# Patient Record
Sex: Female | Born: 1955 | Race: White | Hispanic: No | Marital: Single | Smoking: Current every day smoker
Health system: Southern US, Community
[De-identification: ages and names within clinical notes are randomized; demographics above are authoritative.]

## PROBLEM LIST (undated history)

## (undated) DIAGNOSIS — N200 Calculus of kidney: Secondary | ICD-10-CM

## (undated) DIAGNOSIS — J45909 Unspecified asthma, uncomplicated: Secondary | ICD-10-CM

## (undated) DIAGNOSIS — R6889 Other general symptoms and signs: Secondary | ICD-10-CM

## (undated) DIAGNOSIS — R5383 Other fatigue: Secondary | ICD-10-CM

## (undated) DIAGNOSIS — I059 Rheumatic mitral valve disease, unspecified: Secondary | ICD-10-CM

## (undated) DIAGNOSIS — M419 Scoliosis, unspecified: Secondary | ICD-10-CM

## (undated) DIAGNOSIS — I639 Cerebral infarction, unspecified: Secondary | ICD-10-CM

## (undated) DIAGNOSIS — E785 Hyperlipidemia, unspecified: Secondary | ICD-10-CM

## (undated) DIAGNOSIS — F419 Anxiety disorder, unspecified: Secondary | ICD-10-CM

## (undated) DIAGNOSIS — Z720 Tobacco use: Secondary | ICD-10-CM

## (undated) DIAGNOSIS — I38 Endocarditis, valve unspecified: Secondary | ICD-10-CM

## (undated) DIAGNOSIS — I10 Essential (primary) hypertension: Secondary | ICD-10-CM

## (undated) DIAGNOSIS — Z8659 Personal history of other mental and behavioral disorders: Secondary | ICD-10-CM

## (undated) HISTORY — DX: Calculus of kidney: N20.0

## (undated) HISTORY — DX: Endocarditis, valve unspecified: I38

## (undated) HISTORY — DX: Hyperlipidemia, unspecified: E78.5

## (undated) HISTORY — DX: Scoliosis, unspecified: M41.9

## (undated) HISTORY — DX: Personal history of other mental and behavioral disorders: Z86.59

## (undated) HISTORY — DX: Cerebral infarction, unspecified: I63.9

## (undated) HISTORY — DX: Essential (primary) hypertension: I10

## (undated) HISTORY — DX: Other fatigue: R53.83

## (undated) HISTORY — DX: Unspecified asthma, uncomplicated: J45.909

## (undated) HISTORY — DX: Anxiety disorder, unspecified: F41.9

## (undated) HISTORY — DX: Rheumatic mitral valve disease, unspecified: I05.9

## (undated) HISTORY — PX: ABDOMINAL HYSTERECTOMY: SHX81

## (undated) HISTORY — DX: Tobacco use: Z72.0

## (undated) HISTORY — DX: Other general symptoms and signs: R68.89

---

## 2002-05-04 HISTORY — PX: CARDIAC SURGERY: SHX584

## 2004-05-04 HISTORY — PX: HERNIA REPAIR: SHX51

## 2008-05-04 HISTORY — PX: EYE SURGERY: SHX253

## 2011-05-06 DIAGNOSIS — I38 Endocarditis, valve unspecified: Secondary | ICD-10-CM | POA: Insufficient documentation

## 2011-05-06 DIAGNOSIS — R002 Palpitations: Secondary | ICD-10-CM | POA: Insufficient documentation

## 2011-05-06 DIAGNOSIS — I2510 Atherosclerotic heart disease of native coronary artery without angina pectoris: Secondary | ICD-10-CM | POA: Insufficient documentation

## 2011-05-06 DIAGNOSIS — I10 Essential (primary) hypertension: Secondary | ICD-10-CM | POA: Insufficient documentation

## 2011-06-14 DIAGNOSIS — N200 Calculus of kidney: Secondary | ICD-10-CM | POA: Insufficient documentation

## 2011-06-14 DIAGNOSIS — K929 Disease of digestive system, unspecified: Secondary | ICD-10-CM | POA: Insufficient documentation

## 2011-06-14 DIAGNOSIS — E785 Hyperlipidemia, unspecified: Secondary | ICD-10-CM | POA: Insufficient documentation

## 2011-06-14 DIAGNOSIS — Z8659 Personal history of other mental and behavioral disorders: Secondary | ICD-10-CM | POA: Insufficient documentation

## 2011-06-14 DIAGNOSIS — Z8742 Personal history of other diseases of the female genital tract: Secondary | ICD-10-CM | POA: Insufficient documentation

## 2011-06-14 DIAGNOSIS — M419 Scoliosis, unspecified: Secondary | ICD-10-CM | POA: Insufficient documentation

## 2011-06-14 DIAGNOSIS — Z720 Tobacco use: Secondary | ICD-10-CM | POA: Insufficient documentation

## 2011-06-14 DIAGNOSIS — J45909 Unspecified asthma, uncomplicated: Secondary | ICD-10-CM | POA: Insufficient documentation

## 2011-06-29 DIAGNOSIS — R6889 Other general symptoms and signs: Secondary | ICD-10-CM | POA: Insufficient documentation

## 2011-06-29 DIAGNOSIS — R5383 Other fatigue: Secondary | ICD-10-CM | POA: Insufficient documentation

## 2014-10-20 DIAGNOSIS — R569 Unspecified convulsions: Secondary | ICD-10-CM | POA: Insufficient documentation

## 2014-10-25 DIAGNOSIS — F419 Anxiety disorder, unspecified: Secondary | ICD-10-CM | POA: Insufficient documentation

## 2015-01-24 ENCOUNTER — Other Ambulatory Visit: Payer: Self-pay | Admitting: Family Medicine

## 2015-01-24 DIAGNOSIS — I82402 Acute embolism and thrombosis of unspecified deep veins of left lower extremity: Secondary | ICD-10-CM

## 2015-01-28 ENCOUNTER — Ambulatory Visit
Admission: RE | Admit: 2015-01-28 | Discharge: 2015-01-28 | Disposition: A | Discharge status: Home / Self Care | Payer: Medicare Other | Source: Ambulatory Visit | Attending: Family Medicine | Admitting: Family Medicine

## 2015-01-28 DIAGNOSIS — I82402 Acute embolism and thrombosis of unspecified deep veins of left lower extremity: Secondary | ICD-10-CM

## 2015-01-28 DIAGNOSIS — Z86718 Personal history of other venous thrombosis and embolism: Secondary | ICD-10-CM | POA: Insufficient documentation

## 2015-03-05 ENCOUNTER — Other Ambulatory Visit: Payer: Self-pay | Admitting: Ophthalmology

## 2015-03-05 DIAGNOSIS — H538 Other visual disturbances: Secondary | ICD-10-CM

## 2015-03-05 DIAGNOSIS — H5340 Unspecified visual field defects: Secondary | ICD-10-CM

## 2015-03-15 ENCOUNTER — Ambulatory Visit
Admission: RE | Admit: 2015-03-15 | Discharge: 2015-03-15 | Disposition: A | Discharge status: Home / Self Care | Payer: Medicare Other | Source: Ambulatory Visit | Attending: Ophthalmology | Admitting: Ophthalmology

## 2015-03-15 DIAGNOSIS — H5340 Unspecified visual field defects: Secondary | ICD-10-CM | POA: Insufficient documentation

## 2015-03-15 DIAGNOSIS — H538 Other visual disturbances: Secondary | ICD-10-CM | POA: Diagnosis present

## 2015-03-15 MED ORDER — GADOBENATE DIMEGLUMINE 529 MG/ML IV SOLN
10.0000 mL | Freq: Once | INTRAVENOUS | Status: AC | PRN
  Administered 2015-03-15: 10 mL via INTRAVENOUS

## 2015-05-22 ENCOUNTER — Ambulatory Visit: Payer: Medicare Other | Admitting: Pain Medicine

## 2015-08-28 ENCOUNTER — Other Ambulatory Visit: Payer: Self-pay

## 2015-08-28 ENCOUNTER — Ambulatory Visit: Payer: Self-pay | Admitting: Surgery

## 2015-09-09 ENCOUNTER — Other Ambulatory Visit: Payer: Self-pay

## 2015-09-09 ENCOUNTER — Encounter: Payer: Self-pay | Admitting: General Surgery

## 2015-09-09 ENCOUNTER — Ambulatory Visit (INDEPENDENT_AMBULATORY_CARE_PROVIDER_SITE_OTHER): Payer: Medicare Other | Admitting: General Surgery

## 2015-09-09 VITALS — BP 146/67 | HR 62 | Temp 97.9°F | Wt 134.0 lb

## 2015-09-09 DIAGNOSIS — K432 Incisional hernia without obstruction or gangrene: Secondary | ICD-10-CM | POA: Insufficient documentation

## 2015-09-09 NOTE — Progress Notes (Signed)
Patient ID: Brittany Sanders, female   DOB: [birth year], 60 y.o.   MRN: [id number]  CC: Hernia  HPI Brittany Sanders is a 60 y.o. female who presents to clinic today for evaluation of a ventral hernia. Patient has a complex history in the very poor historian. She tells a story of writing jet ski and feeling a sudden pop with bulge and pain. She now states every time she eats it bulges out "like the alien from that movie". She states she's had to decrease the amount she eats secondary to the holding and pain. She does have a history of chronic pain and is on chronic pain medication. Of note she has documentation from her surgeon at [facility] the diagnosis hernia in 2007. Patient denies any knowledge of this. She has chronic abdominal pain, chronic nausea, chronic constipation. She denies any fevers, chills, headache, shortness of breath. She does also have chronic chest pain. She has been seen for all these problems before at [facility] and was dissatisfied with her care there. She adamantly denies current tobacco use however has a strong odor of cigarette smoke.  HPI  Past Medical History  Diagnosis Date  . Anxiety   . Hyperactive airway disease   . Cold intolerance   . Stroke (cerebrum) (HCC)   . Dyslipidemia   . Fatigue   . H/O anorexia nervosa   . Hypertension   . Scoliosis   . Tobacco abuse   . Heart valve disease   . Calculus of kidney   . Mitral valve disorder     Past Surgical History  Procedure Laterality Date  . Abdominal hysterectomy    . Cardiac surgery  2004    stents were placed  . Hernia repair  2006    Done at [facility]  . Eye surgery Bilateral 2010    Family History  Problem Relation Age of Onset  . Heart disease Mother     triple heart bipass    Social History Social History  Substance Use Topics  . Smoking status: Current Every Day Smoker  . Smokeless tobacco: None  . Alcohol Use: No    Allergies  Allergen Reactions  . Azithromycin Anxiety    Other  Reaction: generic version  . Tape Rash and Shortness Of Breath    Uncoded Allergy. Allergen: generic Z-pak, Other Reaction: chest pain  . Clarithromycin   . Iodinated Diagnostic Agents   . Latex   . Lisinopril   . Metoprolol   . Penicillins   . Gabapentin Anxiety    Tightness in chest, high blood pressure  . Prednisone Rash    Other Reaction: Tachycardia  . Sulfa Antibiotics Rash    Other Reaction: blisters    Current Outpatient Prescriptions  Medication Sig Dispense Refill  . albuterol (VENTOLIN HFA) 108 (90 Base) MCG/ACT inhaler Inhale 1 puff into the lungs every morning.    Marland Kitchen ALPRAZolam (XANAX) 1 MG tablet Take 1 tablet by mouth.    Marland Kitchen aspirin EC 81 MG tablet Take 1 tablet by mouth.    Marland Kitchen atorvastatin (LIPITOR) 10 MG tablet Take 1 tablet by mouth.    . clonazePAM (KLONOPIN) 1 MG tablet Take 1 mg by mouth at bedtime.  0  . cycloSPORINE (RESTASIS) 0.05 % ophthalmic emulsion Apply 2 drops to eye 1 day or 1 dose.    . diazepam (VALIUM) 10 MG tablet Take 10 mg by mouth 3 (three) times daily.  0  . esomeprazole (NEXIUM) 40 MG capsule Take  1 tablet by mouth.    . metoprolol succinate (TOPROL XL) 25 MG 24 hr tablet Take 1 tablet by mouth.    . nicotine (RA NICOTINE) 21 mg/24hr patch Place 1 patch onto the skin.    Marland Kitchen. nitroGLYCERIN (NITROLINGUAL) 0.4 MG/SPRAY spray Place 1 spray under the tongue.    . nitroGLYCERIN (NITROSTAT) 0.4 MG SL tablet Take 1 tablet by mouth.    . ondansetron (ZOFRAN) 4 MG tablet Take 1 tablet by mouth as needed.    Marland Kitchen. PERCOCET 10-325 MG tablet Take 1 tablet by mouth.  0  . phenytoin (DILANTIN) 300 MG ER capsule Take 1 capsule by mouth.    . potassium chloride SA (K-DUR,KLOR-CON) 20 MEQ tablet Take 1 tablet by mouth 1 day or 1 dose.    . tiotropium (SPIRIVA HANDIHALER) 18 MCG inhalation capsule Place 2 puffs into inhaler and inhale.    Marland Kitchen. tiZANidine (ZANAFLEX) 4 MG tablet Take 1 tablet by mouth 1 day or 1 dose.    . triamterene-hydrochlorothiazide (DYAZIDE) 37.5-25  MG capsule Take 1 tablet by mouth.     No current facility-administered medications for this visit.     Review of Systems A Multi-point review of systems was asked and was negative except findings documented in the history of present illness  Physical Exam Blood pressure 146/67, pulse 62, temperature 97.9 F (36.6 C), temperature source Oral, weight 60.782 kg (134 lb). CONSTITUTIONAL: No acute distress. EYES: Pupils are equal, round, and reactive to light, Sclera are non-icteric. EARS, NOSE, MOUTH AND THROAT: The oropharynx is clear. The oral mucosa is pink and moist. Hearing is intact to voice. LYMPH NODES:  Lymph nodes in the neck are normal. RESPIRATORY:  Lungs are clear. There is normal respiratory effort, with equal breath sounds bilaterally, and without pathologic use of accessory muscles. CARDIOVASCULAR: Heart is regular but with an audible murmur. GI: The abdomen is soft, tender to palpation in all quadrants without obvious source, and nondistended. There is a visible and easily reducible midline incisional hernia at a prior 15 mm laparoscopic incision site. There is no hepatosplenomegaly. There are normal bowel sounds in all quadrants. GU: Rectal deferred.   MUSCULOSKELETAL: Normal muscle strength and tone. No cyanosis or edema.   SKIN: Turgor is good and there are no pathologic skin lesions or ulcers. NEUROLOGIC: Motor and sensation is grossly normal. Cranial nerves are grossly intact. PSYCH:  Oriented to person, place and time. Affect is normal.  Data Reviewed There are no images and labs reviewed for this visit I have personally reviewed the patient's imaging, laboratory findings and medical records.    Assessment    Incisional hernia    Plan    60 year old female with an incisional hernia from a laparoscopic trocar in the midline. Had long conversation with the patient about the surgery for repair. This includes the risks, benefits, alternatives of an open incisional  hernia repair. Also discussed at length the signs and symptoms of incarceration or strangulation and to seek immediate follow-up should they occur. Patient voiced understanding. Also had a long conversation patient given her multiple medical problems including cardiac and neurologic history that she would require cardiac and medical clearance prior to any surgery with [facility]. And although patient vehemently denied current smoking, she did have a strong odor of cigarettes and I discussed at length that if she is smoking that I would not do an elective hernia repair on her. Patient has a complex history and I did offer to send her  back to her original surgeon however she declined. She'll follow up after she completes medical and cardiac clearance.     Time spent with the patient was 60 minutes, with more than 50% of the time spent in face-to-face education, counseling and care coordination.     Brittany Frame, MD FACS General Surgeon 09/09/2015, 10:14 AM

## 2015-09-09 NOTE — Patient Instructions (Signed)
Can you please call your cardiologist-Dr. Okey Duprerawford and schedule an appointment to get cardiac clearance. We also need you to get medical clearance from Dr. Quillian QuinceBliss. Once you see them, please give us a call so we could schedule an appointment so we could schedule your surgery.  You are able to use an abdominal binder to help.

## 2015-09-10 ENCOUNTER — Telehealth: Payer: Self-pay

## 2015-09-10 NOTE — Telephone Encounter (Signed)
I requested Medical (Dr. Joen LauraLaura Bliss) and Cardiac [facility] Dr. Lovie CholLawrence Crawford) Clearance today. Awaiting for clearance in order for patient to follow up with our surgeon and then schedule her surgery.

## 2015-09-23 ENCOUNTER — Telehealth: Payer: Self-pay

## 2015-09-23 NOTE — Telephone Encounter (Signed)
Called patient to let her know that I had received cardiac clearance from Dr. Lovie CholLawrence Crawford. I told her that I was calling to schedule her follow up appointment to come and see Dr. Tonita CongWoodham to schedule surgery. However, patient did not want to come in until she saw Dr. Lovie CholLawrence Crawford. She was confused as how he had agreed for her to have surgery with us if she had not seen Dr. Okey Duprerawford since last year (03/2015). Patient asked me to help her schedule an appointment with Dr. Okey Duprerawford before she comes in to see Dr. Tonita CongWoodham. I told her that I would help her. After I hung up with the patient, I called Dr. Frutoso Chaserawford's office [phone] and scheduled an appointment on (12/16/2015 at 1:00 PM).  Then i called patient back to let her know when her appointment would be with dr. Okey Duprerawford. Patient was appreciative and stated that when she would see Dr. Okey Duprerawford that she was going to let him know that she needed a Ventral Hernia Repair but first needed to be seen by him so he could give the okay for surgery.  I told patient that once she saw Dr. Okey Duprerawford, to call us and let us know so we could schedule a follow up appointment to schedule a surgery date. Patient understood and had no further questions.  ===View-only below this line===  ----- Message -----    From: Brittany Sanders, CMA    Sent: 09/20/2015      To: Brittany PortsMaritza Kao Sanders, CMA Subject: Clearance                                      Cardiac and medical clearance were sent to her doctors. Check on status and once it has been obtained, schedule a follow up appointment for Ventral Hernia with Dr. Tonita CongWoodham.

## 2015-10-14 ENCOUNTER — Telehealth: Payer: Self-pay

## 2015-10-14 NOTE — Telephone Encounter (Signed)
Called patient to let her know that I had received her cardiac and medical clearance and I was calling her to schedule an appointment with Dr. Tonita CongWoodham to discuss surgery. Patient stated that she was recently diagnosed with Pneumonia and would like to wait until she felt better. I asked her if she would like to see another physician to schedule her surgery soon and she stated that she wants to see Dr. Tonita CongWoodham. Therefore, I told patient that I could schedule a follow up appointment until July. Patient was fine with that. Her appointment will be on 11/18/2015 at 2:00 PM with Dr. Tonita CongWoodham at our [facility]. Patient agreed.

## 2015-10-24 ENCOUNTER — Telehealth: Payer: Self-pay

## 2015-10-24 NOTE — Telephone Encounter (Signed)
Received Return to sender Appointment reminder. Please verify address.

## 2015-11-18 ENCOUNTER — Encounter: Payer: Self-pay | Admitting: General Surgery

## 2015-11-18 ENCOUNTER — Ambulatory Visit (INDEPENDENT_AMBULATORY_CARE_PROVIDER_SITE_OTHER): Payer: Medicare Other | Admitting: General Surgery

## 2015-11-18 ENCOUNTER — Telehealth: Payer: Self-pay

## 2015-11-18 VITALS — BP 142/74 | HR 63 | Temp 98.1°F | Ht 63.0 in | Wt 135.0 lb

## 2015-11-18 DIAGNOSIS — K432 Incisional hernia without obstruction or gangrene: Secondary | ICD-10-CM

## 2015-11-18 NOTE — Telephone Encounter (Signed)
[facility] for Incisional Hernia Repair. Patient has COPD with recent fever and productive cough. Has history of stroke after a prior surgery. Not a candidate for surgery at [facility].

## 2015-11-18 NOTE — Telephone Encounter (Signed)
I have [facility] a [facility] for Ventral hernia repair. All clinic documentation has been faxed to [phone]. Patient will be contact with an appointment from [facility] after clinic notes have been reviewed.

## 2015-11-18 NOTE — Telephone Encounter (Signed)
Called patient to let her know of appointment with PCP Dr.Laura Bliss on 11/20/15 2 10:30. Patient verbalized understanding.    Dr.Bliss- 11/20/15 @ 10:30.

## 2015-11-18 NOTE — Progress Notes (Signed)
Outpatient Surgical Follow Up  11/18/2015  Brittany Sanders is an 60 y.o. female.   Chief Complaint  Patient presents with  . Follow-up    Ventral Hernia    HPI: 60 year old female returns to clinic today for follow-up of her ventral, incisional hernia. Patient reports that the area continues to bother her and has been causing more pain recently. She states that the area has always been reducible since an initial event several months ago that required her to lay down for reduction. The pain is worse with activities or if she becomes constipated. Patient reports she constantly feels bloated and that this is not changed in the last 2 months. She has a chronic cough that has been productive and reports that she was febrile over the weekend with worsening of her cough. The cough has also made the symptomatic hernia even worse. Patient is very anxious and has distorted thoughts about her prior surgical experiences as well as her cardiac, pulmonary, neurologic history. She currently denies any chest pain, nausea, vomiting, diarrhea, constipation. She does state she had a fever over the weekend, she has a chronic cough, chronic shortness of breath, chronic anxiety.  Past Medical History  Diagnosis Date  . Anxiety   . Hyperactive airway disease   . Cold intolerance   . Stroke (cerebrum) (HCC)   . Dyslipidemia   . Fatigue   . H/O anorexia nervosa   . Hypertension   . Scoliosis   . Tobacco abuse   . Heart valve disease   . Calculus of kidney   . Mitral valve disorder     Past Surgical History  Procedure Laterality Date  . Abdominal hysterectomy    . Cardiac surgery  2004    stents were placed  . Hernia [facility]  2006    [facility]  . Eye surgery Bilateral 2010    Family History  Problem Relation Age of Onset  . Heart disease Mother     triple heart bipass    Social History:  reports that she has been smoking.  She does not have any smokeless tobacco history on file. She reports  that she does not drink alcohol or use illicit drugs.  Allergies:  Allergies  Allergen Reactions  . Azithromycin Anxiety    Other Reaction: generic version  . Tape Rash and Shortness Of Breath    Uncoded Allergy. Allergen: generic Z-pak, Other Reaction: chest pain  . Clarithromycin   . Iodinated Diagnostic Agents   . Latex   . Lisinopril   . Metoprolol   . Penicillins   . Gabapentin Anxiety    Tightness in chest, high blood pressure  . Prednisone Rash    Other Reaction: Tachycardia  . Sulfa Antibiotics Rash    Other Reaction: blisters    Medications reviewed.    ROS A multipoint review of systems was completed, all pertinent positives and negatives are documented within the history of present illness and remainder are negative.   BP 142/74 mmHg  Pulse 63  Temp(Src) 98.1 F (36.7 C) (Oral)  Ht 5\' 3"  (1.6 m)  Wt 61.236 kg (135 lb)  BMI 23.92 kg/m2  Physical Exam Gen.: No acute distress, resting comfortably in the exam room Chest: Coarse breath sounds on auscultation without use of accessory muscles equal wall motion bilaterally Heart: Regular rate and rhythm without obvious murmur or dysrhythmia Abdomen: Soft, nondistended, obvious incisional hernia at a prior midline trocar site. The hernia itself is easily reducible but tender to  palpation. Extremities: Moves all extremities well without evidence of edema to any extremity Neuro: Cranial nerves grossly intact and patient has normal gait and stability   No results found for this or any previous visit (from the past 48 hour(s)). No results found.  Assessment/Plan:  1. Incisional hernia, without obstruction or gangrene 60 year old female with an incisional hernia in her midline above her umbilicus. Discussed with her surgical options for hernia repair to include open versus laparoscopic repair. After a long conversation about her numerous comorbidities including cardiac dysfunction, chronic pulmonary disease, history  of a stroke in the postoperative state from her prior surgery, the patient decided that it would be in her best interest to have any further surgery at a larger institution. Discussed that it would be technically possible to have her surgery at [facility] however should she have any complication she would likely require an urgent transfer to a larger institution. Patient voiced understanding and desires for further follow-up at [facility]. We will facilitate this from our clinic on an outpatient status. Discussed that should she have a bulge become stuck at her hernia site that she is report to the nearest emergency room which is a [facility] facility. Since answered to the patient's satisfaction. A total of 25 minutes was spent with this patient with the majority (greater than 50%) of it being counseling and coordination of her further care at [facility].     Ricarda Frame, MD FACS General Surgeon  11/18/2015,11:23 AM

## 2015-11-18 NOTE — Patient Instructions (Signed)
We are referring [facility] to [facility] for your [facility]. We will call and arrange an appointment for you and will call you as soon as this appointment has been made.  You will need to see Dr. Quillian QuinceBliss regarding your Pneumonia. We will call you with this appointment information as well.

## 2017-05-03 IMAGING — MR MR HEAD WO/W CM
13 series · 46 of 48 positions shown · IV contrast (multihance)
Comparison: None.

CLINICAL DATA: Blurred vision/visual field defect.

EXAM:
MRI HEAD WITHOUT AND WITH CONTRAST
TECHNIQUE: Multiplanar, multiecho pulse sequences of the brain and surrounding
structures were obtained without and with intravenous contrast.
CONTRAST:  10mL MULTIHANCE GADOBENATE DIMEGLUMINE 529 MG/ML IV SOLN

[Series 2: T1 · sagittal · 5.0mm · 0.45mm/px · 3 of 22 slices shown (1 of 2)]
[im 1/22]
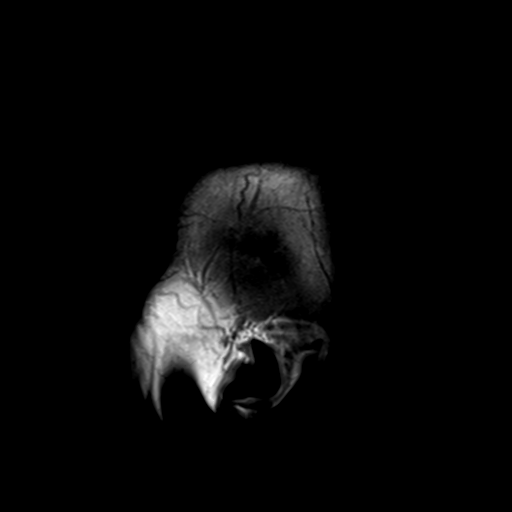
[im 11/22]
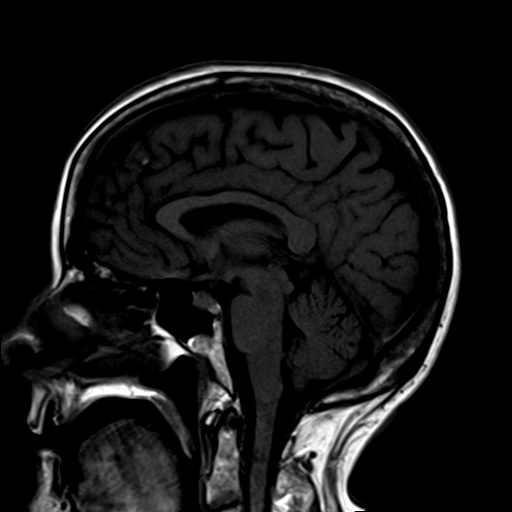
[im 22/22]
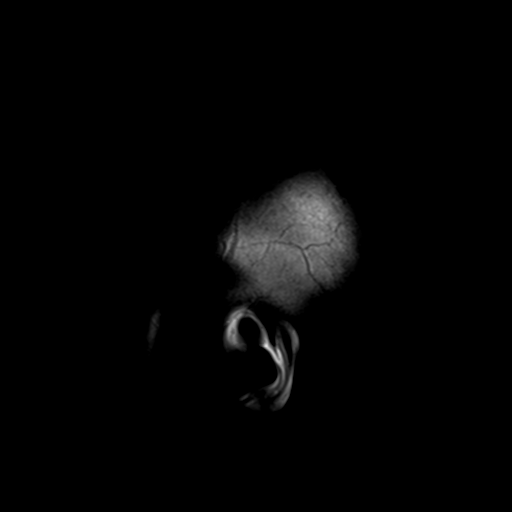

[Series 4: DWI · axial · 3.0mm · 1.20mm/px · z∈[-61,+102]mm · 5 of 56 slices shown (1 of 4)]
[im 1/56]
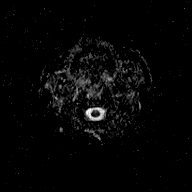
[im 14/56]
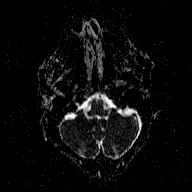
[im 28/56]
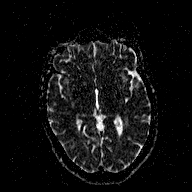
[im 42/56]
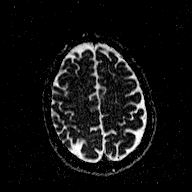
[im 56/56]
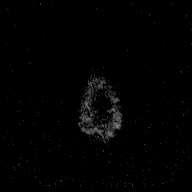

[Series 6: DWI · coronal · 3.0mm · 1.20mm/px · 4 of 46 slices shown (2 of 4)]
[im 1/46]
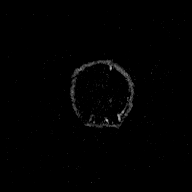
[im 16/46]
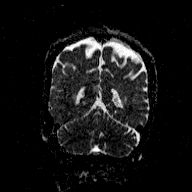
[im 31/46]
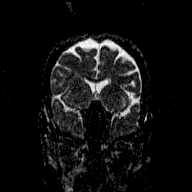
[im 46/46]
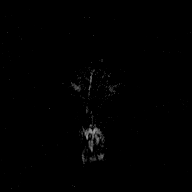

[Series 7: T2 · axial · 5.0mm · 0.72mm/px · z∈[-60,+101]mm · 2 of 26 slices shown (1 of 2)]
[im 1/26]
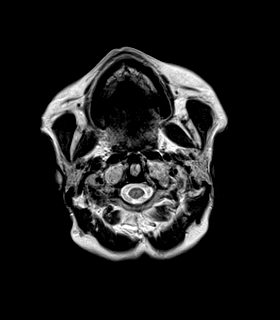
[im 26/26]
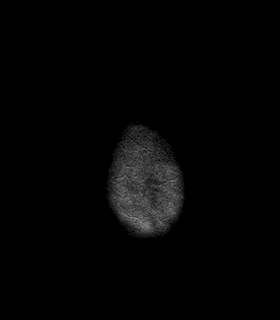

[Series 8: FLAIR · axial · 5.0mm · 0.45mm/px · z∈[-60,+101]mm · 2 of 26 slices shown (1 of 2)]
[im 1/26]
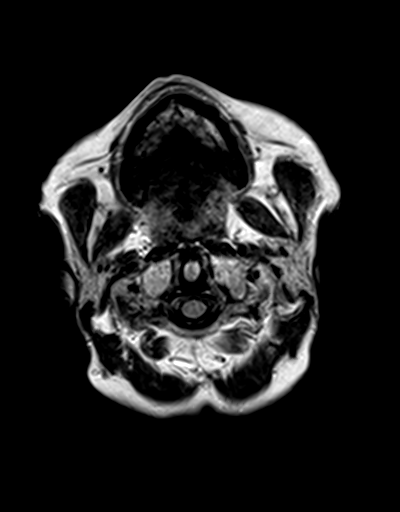
[im 26/26]
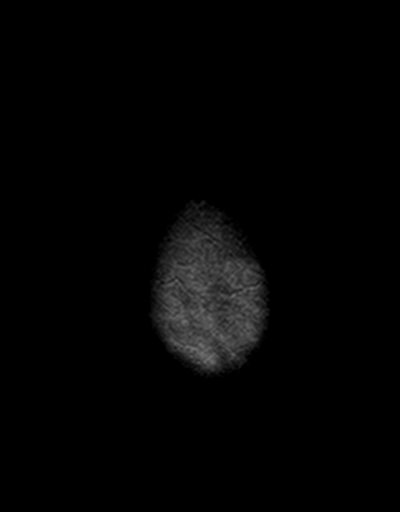

[Series 9: T2 · axial · 5.0mm · 0.72mm/px · z∈[-60,+101]mm · 2 of 26 slices shown (2 of 2)]
[im 1/26]
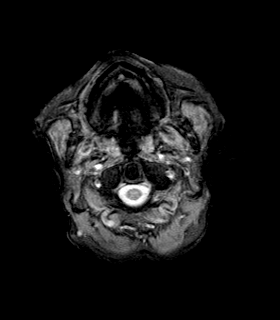
[im 26/26]
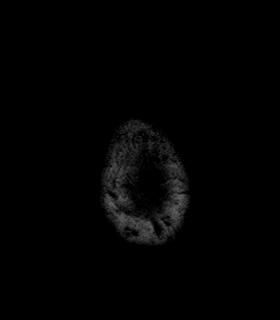

[Series 10: T1 · axial · 3.0mm · 1.00mm/px · z∈[-66,+38]mm · 4 of 60 slices shown (2 of 2)]
[im 1/60]
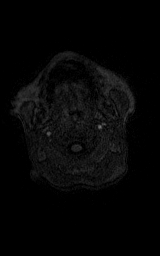
[im 12/60]
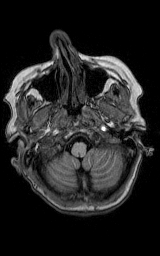
[im 24/60]
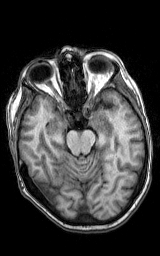
[im 36/60]
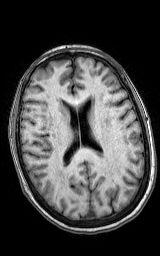

[Series 11: FLAIR · sagittal · 4.0mm · 0.45mm/px · 3 of 28 slices shown (2 of 2)]
[im 1/28]
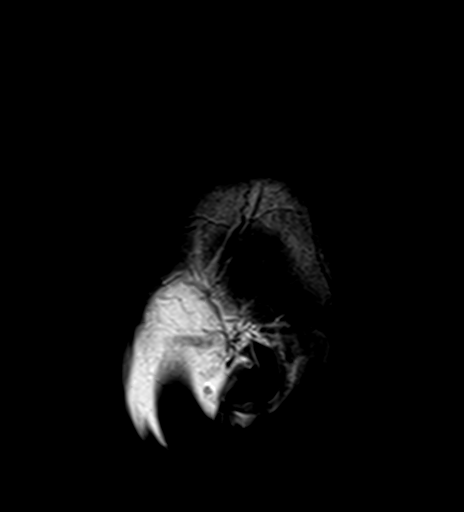
[im 14/28]
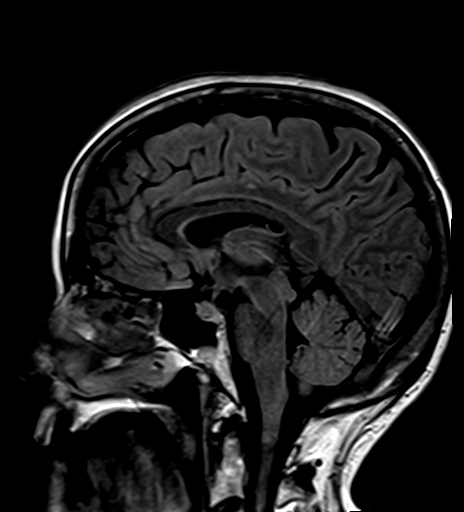
[im 28/28]
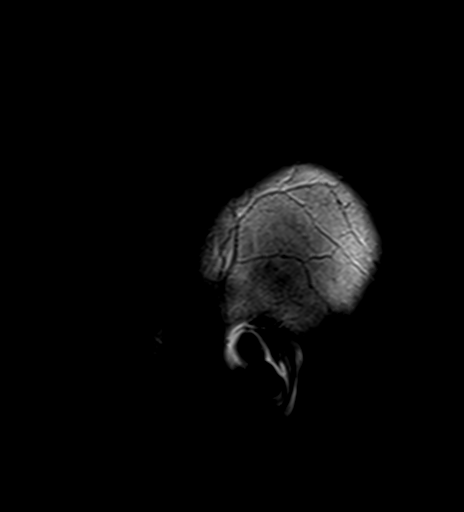

[Series 12: T2 post-contrast · coronal · 5.0mm · 0.45mm/px · 3 of 29 slices shown]
[im 1/29]
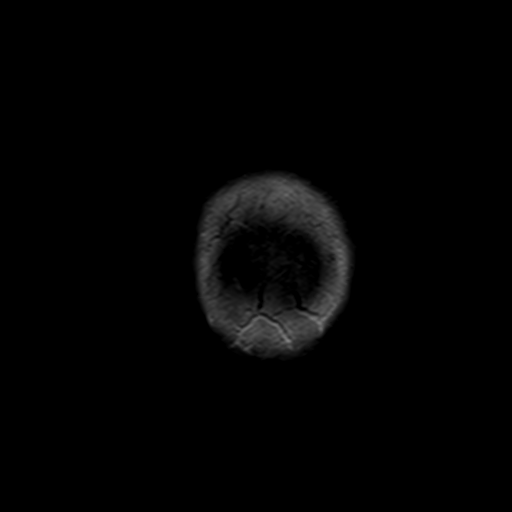
[im 15/29]
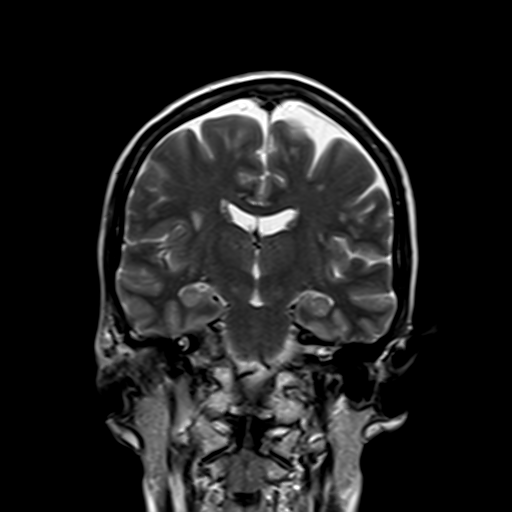
[im 29/29]
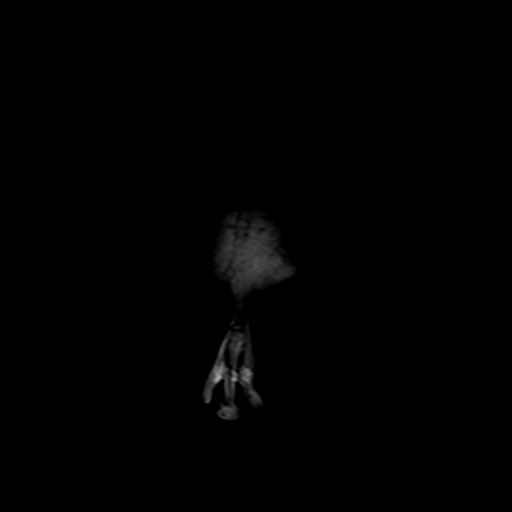

[Series 13: T1 post-contrast · axial · 3.0mm · 1.00mm/px · z∈[-66,+110]mm · 6 of 60 slices shown (1 of 2)]
[im 1/60]
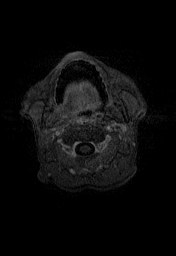
[im 12/60]
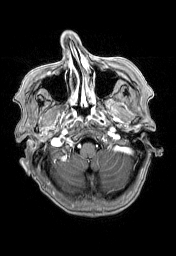
[im 24/60]
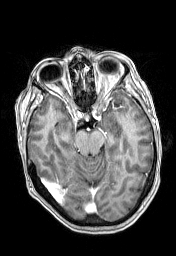
[im 36/60]
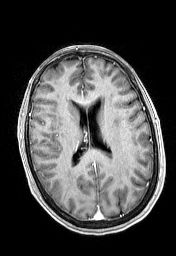
[im 48/60]
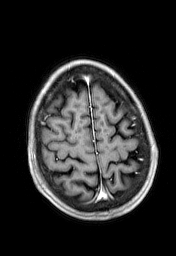
[im 60/60]
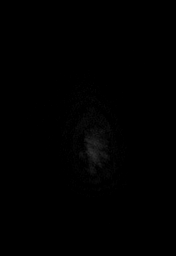

[Series 14: T1 post-contrast · coronal · 5.0mm · 0.45mm/px · 3 of 29 slices shown (2 of 2)]
[im 1/29]
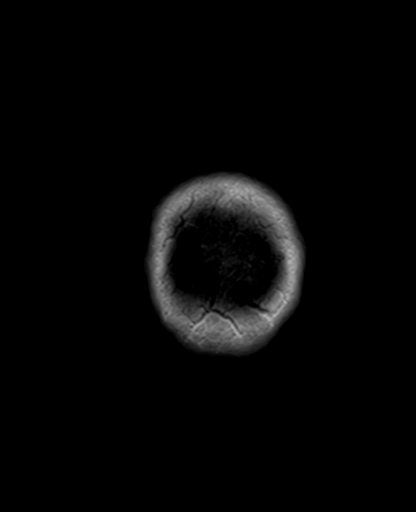
[im 15/29]
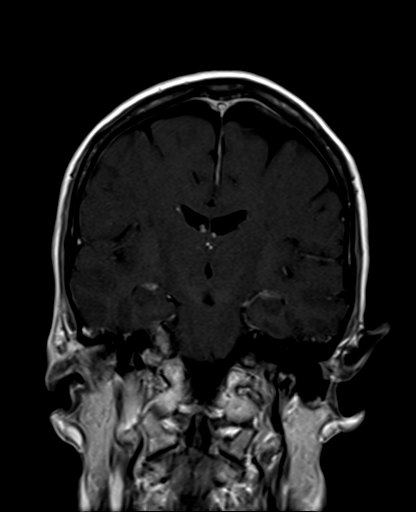
[im 29/29]
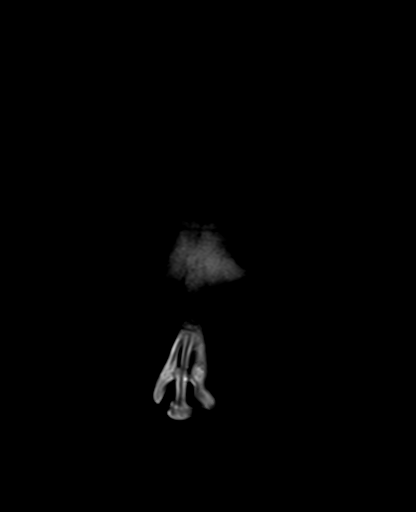

[Series 100: DWI · axial · 3.0mm · 1.20mm/px · z∈[-61,+102]mm · 5 of 56 slices shown (3 of 4)]
[im 1/56]
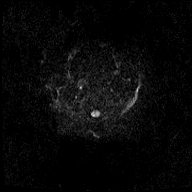
[im 14/56]
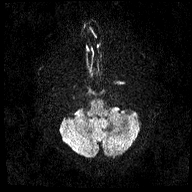
[im 28/56]
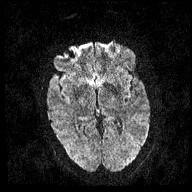
[im 42/56]
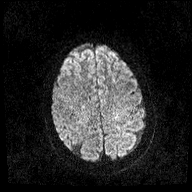
[im 56/56]
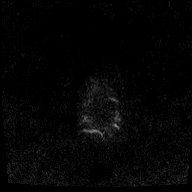

[Series 101: DWI · coronal · 3.0mm · 1.20mm/px · 4 of 46 slices shown (4 of 4)]
[im 1/46]
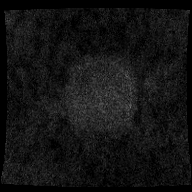
[im 16/46]
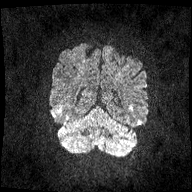
[im 31/46]
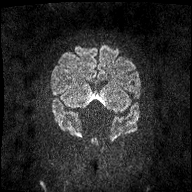
[im 46/46]
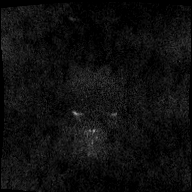

[46 of 48 positions shown; findings below may reference images not displayed]

FINDINGS: There is no evidence of acute infarct, intracranial hemorrhage,
mass, midline shift, or extra-axial fluid collection. Ventricles and
sulci are normal. There are numerous small foci of T2 hyperintensity
in the subcortical greater than deep cerebral white matter
bilaterally, greatest in the frontal lobes and moderately advanced
for age. No abnormal enhancement is identified.

Prior bilateral cataract extraction is noted. Paranasal sinuses and
mastoid air cells are clear. Major intracranial vascular flow voids
are preserved.
IMPRESSION: 1. No acute intracranial abnormality or mass.
2. Moderately advanced cerebral white matter disease for age,
nonspecific but compatible with chronic small vessel ischemia. Other
considerations include sequelae of trauma, hypercoagulable state,
vasculitis, migraines, prior infection or demyelination.
